# Patient Record
Sex: Male | Born: 1990 | Race: Black or African American | Hispanic: No | Marital: Single | Smoking: Current some day smoker
Health system: Southern US, Community
[De-identification: ages and names within clinical notes are randomized; demographics above are authoritative.]

## PROBLEM LIST (undated history)

## (undated) DIAGNOSIS — F840 Autistic disorder: Secondary | ICD-10-CM

## (undated) DIAGNOSIS — F909 Attention-deficit hyperactivity disorder, unspecified type: Secondary | ICD-10-CM

## (undated) HISTORY — PX: ADENOIDECTOMY: SUR15

## (undated) HISTORY — PX: TONSILLECTOMY: SUR1361

---
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
## 2011-08-16 ENCOUNTER — Emergency Department (HOSPITAL_COMMUNITY)
Admission: EM | Admit: 2011-08-16 | Discharge: 2011-08-16 | Disposition: A | Discharge status: Home / Self Care | Payer: Medicaid Other | Attending: Emergency Medicine | Admitting: Emergency Medicine

## 2011-08-16 ENCOUNTER — Emergency Department (HOSPITAL_COMMUNITY): Payer: Medicaid Other

## 2011-08-16 ENCOUNTER — Encounter (HOSPITAL_COMMUNITY): Payer: Self-pay | Admitting: Emergency Medicine

## 2011-08-16 DIAGNOSIS — Y9302 Activity, running: Secondary | ICD-10-CM | POA: Insufficient documentation

## 2011-08-16 DIAGNOSIS — X500XXA Overexertion from strenuous movement or load, initial encounter: Secondary | ICD-10-CM | POA: Insufficient documentation

## 2011-08-16 DIAGNOSIS — S82899A Other fracture of unspecified lower leg, initial encounter for closed fracture: Secondary | ICD-10-CM | POA: Insufficient documentation

## 2011-08-16 MED ORDER — ACETAMINOPHEN-CODEINE #3 300-30 MG PO TABS
1.0000 | ORAL_TABLET | Freq: Four times a day (QID) | ORAL | Status: AC | PRN
Start: 2011-08-16 — End: 2011-08-26

## 2011-08-16 MED ORDER — IBUPROFEN 800 MG PO TABS: 800.0000 mg | ORAL_TABLET | Freq: Three times a day (TID) | ORAL | Status: AC

## 2011-08-16 MED ORDER — ACETAMINOPHEN-CODEINE #3 300-30 MG PO TABS
2.0000 | ORAL_TABLET | Freq: Once | ORAL | Status: AC
  Administered 2011-08-16: 2 via ORAL
  Filled 2011-08-16: qty 2

## 2011-08-16 NOTE — ED Provider Notes (Signed)
History     CSN: [id number]  Arrival date & time 08/16/11  1737   First MD Initiated Contact with Patient 08/16/11 1820      Chief Complaint  Patient presents with  . Ankle Pain    (Consider location/radiation/quality/duration/timing/severity/associated sxs/prior treatment) HPI  Patient presents to emergency department with his mother at bedside with complaint of left ankle injury that happened yesterday when he was running downhill to go to work. Patient states that he inverted his ankle causing pain and swelling. Patient states that he went on to work but had increasing pain and swelling throughout work. Patient states that today he's had increased pain with ambulating. Patient complains of ongoing swelling but denies break in skin. He denies additional injury. Mother states she gave him ibuprofen yesterday with some relief of pain. Pain is aggravated by movement and ambulation and mildly improved with rest. He denies extremity numbness or tingling. He has no known medical problems and takes no medicines on regular basis.  History reviewed. No pertinent past medical history.  Past Surgical History  Procedure Date  . Tonsillectomy   . Adenoidectomy     History reviewed. No pertinent family history.  History  Substance Use Topics  . Smoking status: Never Smoker   . Smokeless tobacco: Not on file  . Alcohol Use: No      Review of Systems  All other systems reviewed and are negative.    Allergies  Penicillins  Home Medications   Current Outpatient Rx  Name Route Sig Dispense Refill  . IBUPROFEN 200 MG PO TABS Oral Take 200 mg by mouth every 8 (eight) hours as needed. For pain.      BP 119/66  Pulse 71  Temp 98.7 F (37.1 C) (Oral)  Resp 16  SpO2 100%  Physical Exam  Nursing note and vitals reviewed. Constitutional: He is oriented to person, place, and time. He appears well-developed.  HENT:  Head: Normocephalic and atraumatic.  Eyes: Conjunctivae are  normal.  Neck: Normal range of motion. Neck supple.  Cardiovascular: Normal rate.   Pulmonary/Chest: Effort normal.  Musculoskeletal: Normal range of motion. He exhibits edema and tenderness (decreased range of motion of left ankle due to pain. Tenderness to palpation of left lateral ankle with swelling of soft tissue of left lateral ankle. No tenderness to palpation of lower extremity or forefoot. Wiggling toes without difficulty. Good pedal p).  Neurological: He is alert and oriented to person, place, and time.  Skin: Skin is warm and dry. No rash noted. No erythema. No pallor.    ED Course  Procedures (including critical care time)  By mouth Tylenol #3, ice, crutches, and splint.  Labs Reviewed - No data to display Dg Ankle Complete Left  08/16/2011  *RADIOLOGY REPORT*  Clinical Data: Twisting injury of left ankle with lateral pain.  LEFT ANKLE COMPLETE - 3+ VIEW  Comparison: None.  Findings: Soft tissue swelling present particularly overlying the lateral aspect of the ankle.  There is subtle cortical irregularity and lucency in the lateral malleolus likely consistent with nondisplaced fracture.  On the mortise view, there also is associated mild widening of the medial mortise.  No other fractures are identified.  IMPRESSION: Nondisplaced fracture of the lateral malleolus with associated mild widening of the medial mortise.  Original Report Authenticated By: Reola Calkins, M.D.     1. Ankle fracture       MDM  Left lower extremity neurovascularly intact. Splinted in the ER and given crutches.  Spoke at length with patient his mother about the need for close orthopedic followup for further evaluation and management of his ankle fracture. They both voiced their understanding agreeable plan. I told him that he should not bare weight on this injury. He voices understanding.        [place], [place] 08/16/11 1928

## 2011-08-16 NOTE — ED Notes (Signed)
Patient reports that he tripped and fell yesterday and has pain to ankle - and swelling. The patient denies any cracks or pops with injury

## 2011-08-17 NOTE — ED Provider Notes (Signed)
Medical screening examination/treatment/procedure(s) were performed by non-physician practitioner and as supervising physician I was immediately available for consultation/collaboration.  Mithcell Schumpert, MD 08/17/11 2336 

## 2011-11-11 ENCOUNTER — Encounter (HOSPITAL_COMMUNITY): Payer: Self-pay | Admitting: Emergency Medicine

## 2011-11-11 ENCOUNTER — Emergency Department (HOSPITAL_COMMUNITY)
Admission: EM | Admit: 2011-11-11 | Discharge: 2011-11-11 | Disposition: A | Discharge status: Home / Self Care | Payer: Medicaid Other | Attending: Emergency Medicine | Admitting: Emergency Medicine

## 2011-11-11 DIAGNOSIS — K130 Diseases of lips: Secondary | ICD-10-CM | POA: Insufficient documentation

## 2011-11-11 DIAGNOSIS — K1370 Unspecified lesions of oral mucosa: Secondary | ICD-10-CM

## 2011-11-11 NOTE — ED Notes (Signed)
Pt reports bit inside lower lip 2 weeks ago, has sore to lip now, denies pain to sore

## 2011-11-12 NOTE — ED Provider Notes (Signed)
History     CSN: [id number]  Arrival date & time 11/11/11  2020   First MD Initiated Contact with Patient 11/11/11 2108      Chief Complaint  Patient presents with  . Lip Sore    HPI  History provided by the patient. Patient is a 21 year old male with no significant PMH who presents with concerns for lip swelling. Patient reports biting his lower lip with injury and bleeding several weeks ago. Since that time he reports the lip was healing. There was an area that began to swell that he poked and caused significant bleeding. Now since that time there has been another bump to her lower lip. He denies any pain but states this is large and give [place] an away if his teeth and he sometimes bites area accidentally. He denies having any history of keloids are on the usual healing and scarring. He denies any tobacco use. Patient denies having any other similar symptoms previously.    History reviewed. No pertinent past medical history.  Past Surgical History  Procedure Date  . Tonsillectomy   . Adenoidectomy     History reviewed. No pertinent family history.  History  Substance Use Topics  . Smoking status: Never Smoker   . Smokeless tobacco: Not on file  . Alcohol Use: No      Review of Systems  Constitutional: Negative for fever and chills.  HENT: Negative for sore throat and trouble swallowing.   Gastrointestinal: Negative for nausea and vomiting.    Allergies  Penicillins  Home Medications   Current Outpatient Rx  Name Route Sig Dispense Refill  . IBUPROFEN 200 MG PO TABS Oral Take 200 mg by mouth every 8 (eight) hours as needed. For pain.      BP 137/72  Pulse 71  Temp 98.1 F (36.7 C) (Oral)  Resp 18  SpO2 100%  Physical Exam  Nursing note and vitals reviewed. Constitutional: He is oriented to person, place, and time. He appears well-developed and well-nourished. No distress.  HENT:  Head: Normocephalic.       1 cm x 4mm deep circular pedunculated lesion to  the medial inner lower lip. No color change of the mucosa. No bleeding or drainage. Lesion is firm without fluctuance. No increased vascularity.  Neck: Normal range of motion. Neck supple.  Cardiovascular: Normal rate and regular rhythm.   Pulmonary/Chest: Effort normal and breath sounds normal.  Lymphadenopathy:    He has no cervical adenopathy.  Neurological: He is alert and oriented to person, place, and time.    ED Course  Procedures      1. Oral lesion       MDM  Patient seen and evaluated. Patient no acute distress. Patient was referred to ENT and oral surgeon for further evaluation of this lesion.        Angus Seller, [place] 11/12/11 [phone]

## 2011-11-13 NOTE — ED Provider Notes (Signed)
Medical screening examination/treatment/procedure(s) were performed by non-physician practitioner and as supervising physician I was immediately available for consultation/collaboration.  Yamen Castrogiovanni, MD 11/13/11 0642 

## 2011-11-23 ENCOUNTER — Other Ambulatory Visit: Payer: Self-pay | Admitting: Otolaryngology

## 2013-01-20 IMAGING — CR DG ANKLE COMPLETE 3+V*L*
3 series · 3 of 3 positions shown · non-contrast
Comparison: None.

CLINICAL DATA: Twisting injury of left ankle with lateral pain.

LEFT ANKLE COMPLETE - 3+ VIEW

[x ankle ap left]
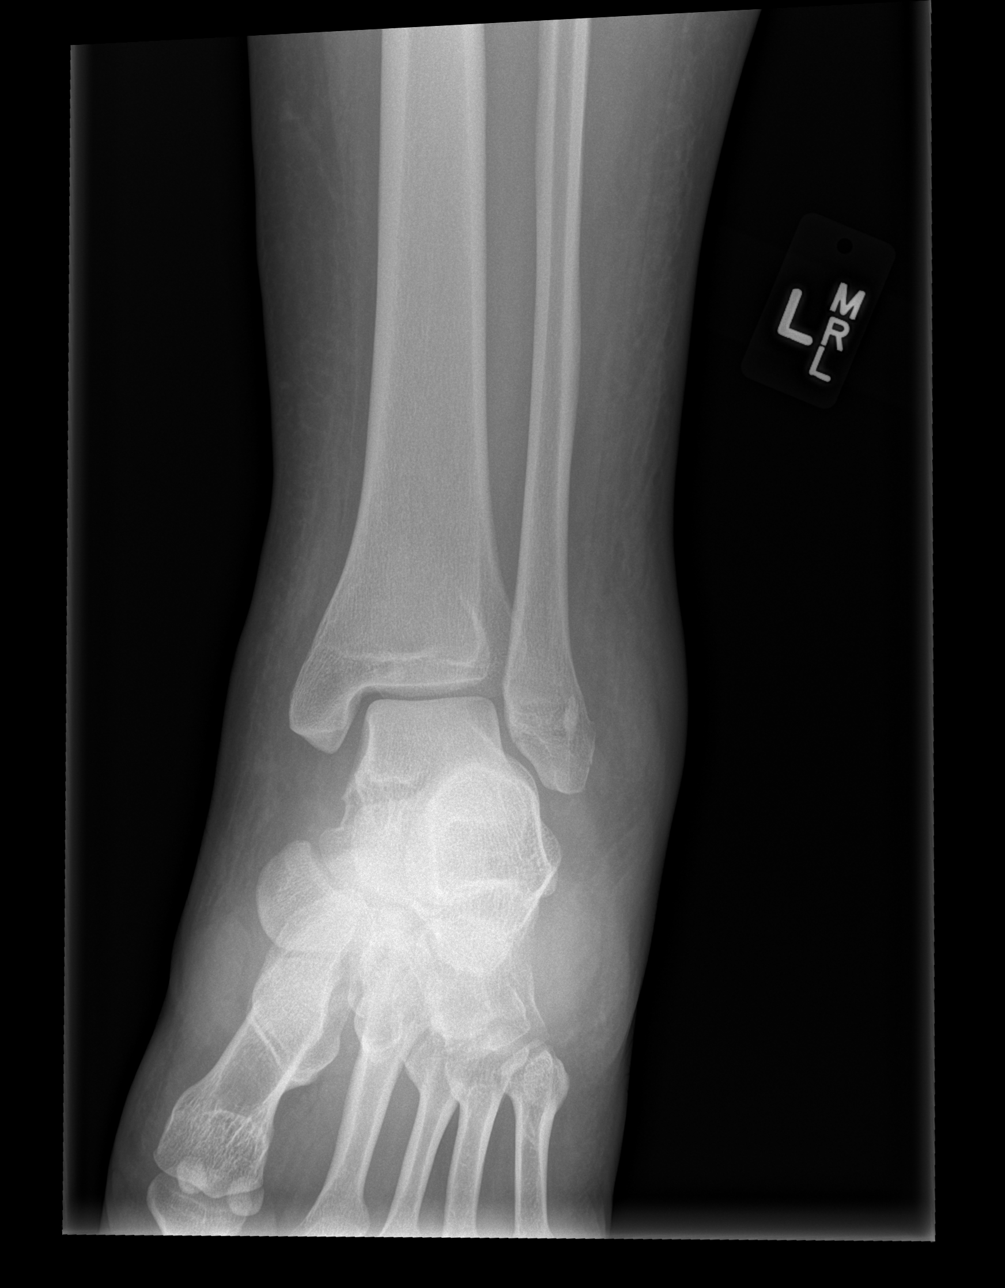

[x ankle obl left]
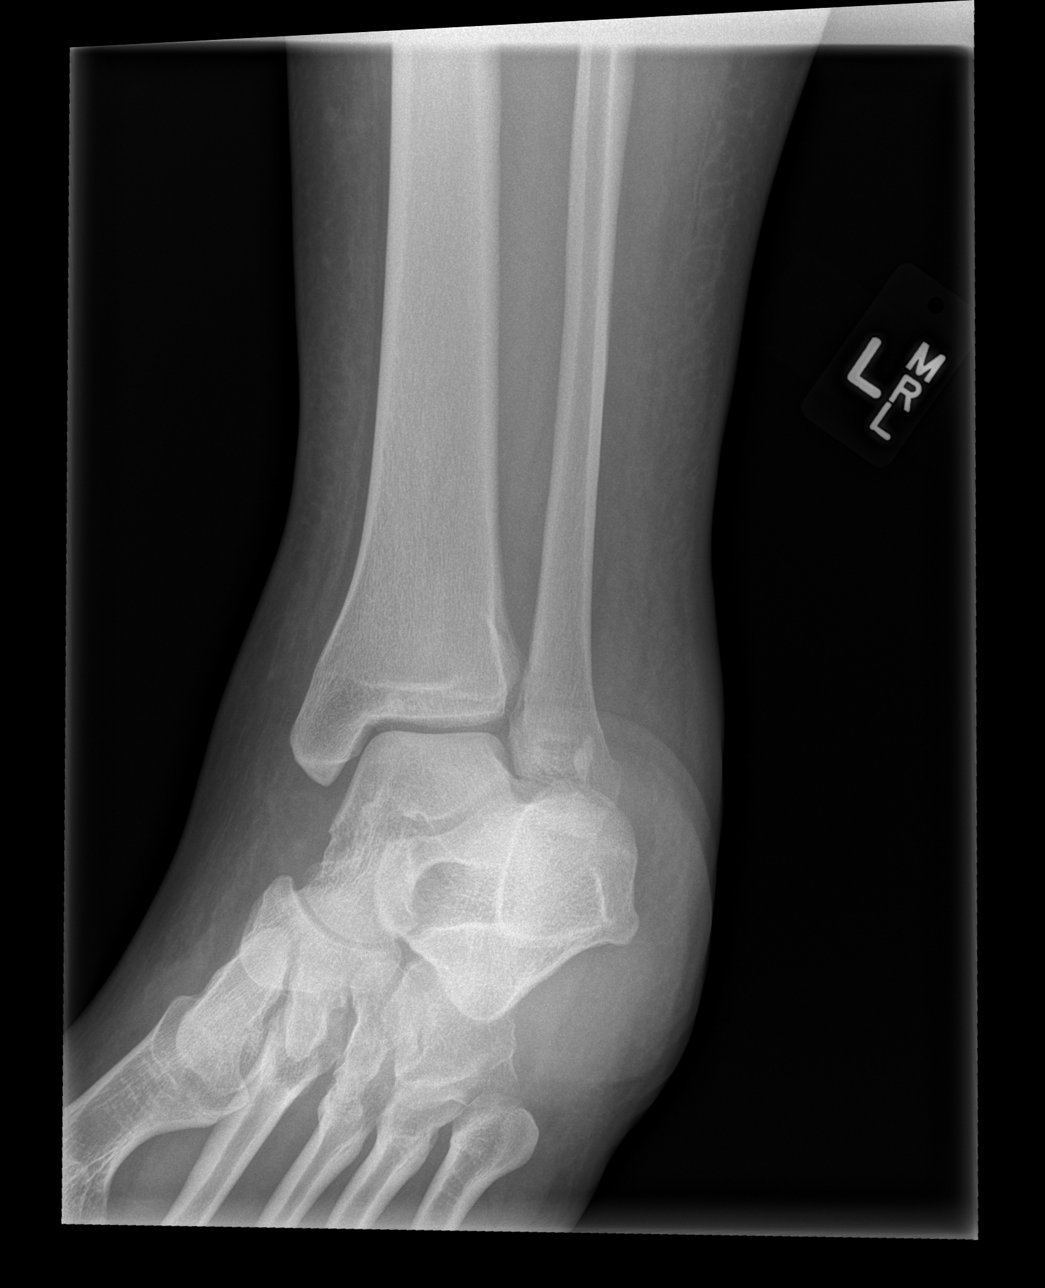

[x ankle lat left]
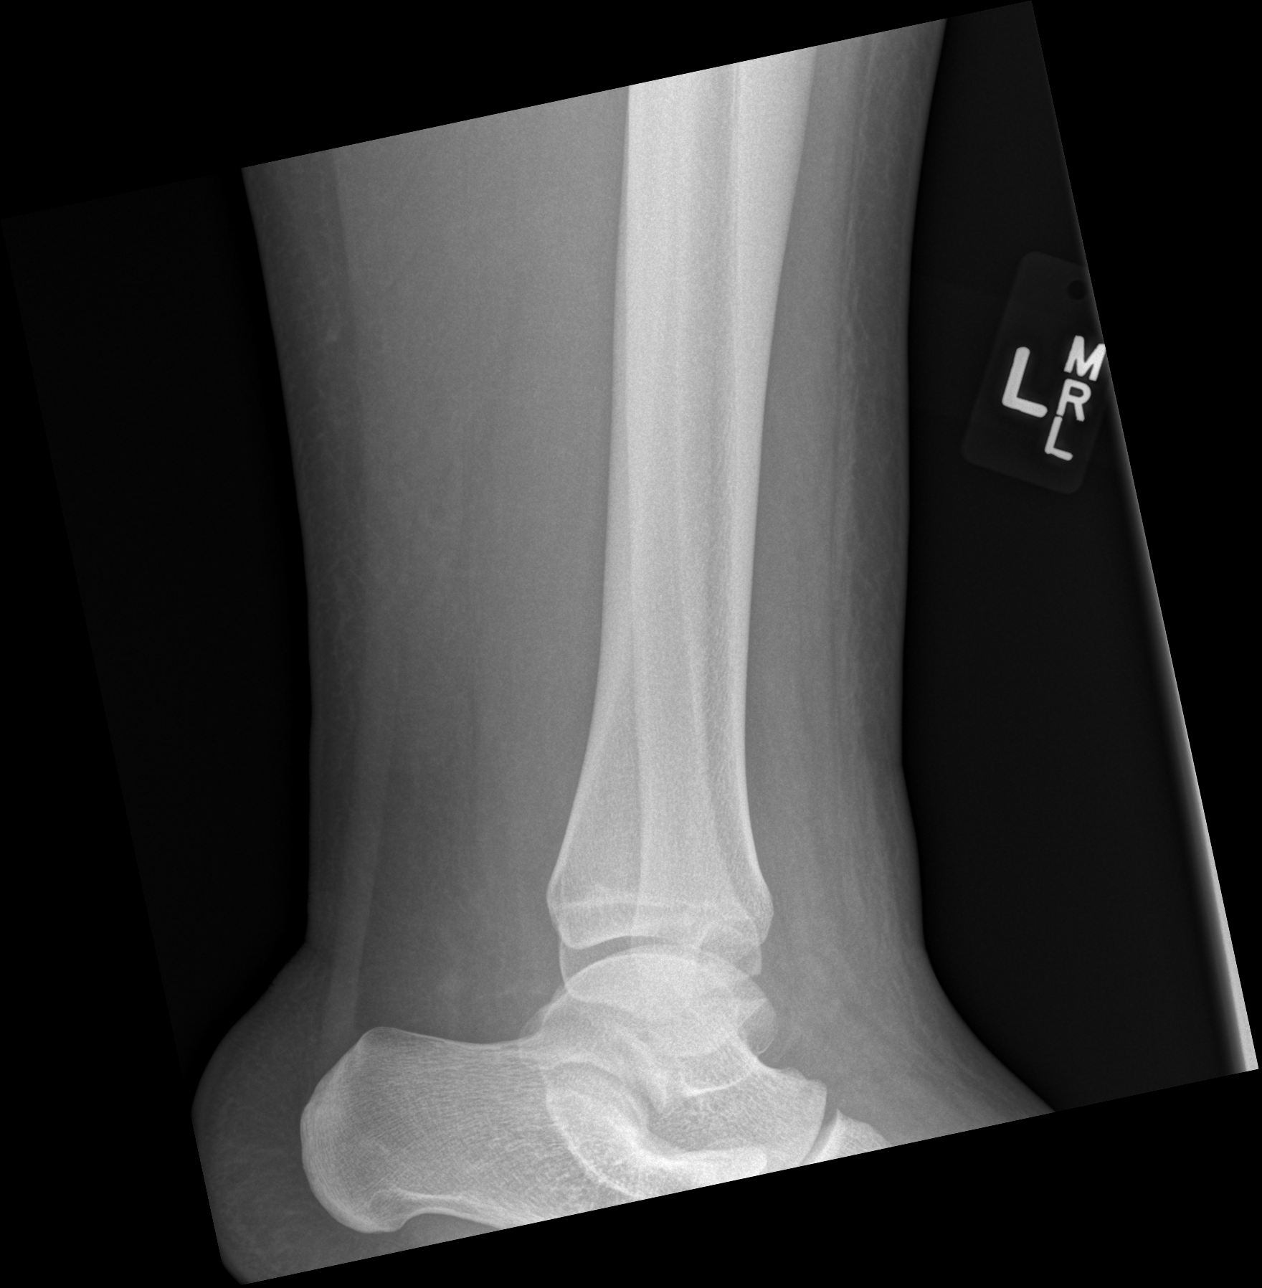

[3 of 3 positions shown; findings below may reference images not displayed]

FINDINGS: Soft tissue swelling present particularly overlying the
lateral aspect of the ankle.  There is subtle cortical irregularity
and lucency in the lateral malleolus likely consistent with
nondisplaced fracture.  On the mortise view, there also is
associated mild widening of the medial mortise.  No other fractures
are identified.
IMPRESSION: Nondisplaced fracture of the lateral malleolus with associated mild
widening of the medial mortise.

## 2016-07-18 ENCOUNTER — Emergency Department (HOSPITAL_COMMUNITY)
Admission: EM | Admit: 2016-07-18 | Discharge: 2016-07-19 | Disposition: A | Discharge status: Home / Self Care | Payer: Medicaid Other | Attending: Emergency Medicine | Admitting: Emergency Medicine

## 2016-07-18 ENCOUNTER — Encounter (HOSPITAL_COMMUNITY): Payer: Self-pay | Admitting: Family Medicine

## 2016-07-18 DIAGNOSIS — F909 Attention-deficit hyperactivity disorder, unspecified type: Secondary | ICD-10-CM | POA: Diagnosis not present

## 2016-07-18 DIAGNOSIS — F918 Other conduct disorders: Secondary | ICD-10-CM | POA: Diagnosis present

## 2016-07-18 DIAGNOSIS — F840 Autistic disorder: Secondary | ICD-10-CM | POA: Insufficient documentation

## 2016-07-18 DIAGNOSIS — R4689 Other symptoms and signs involving appearance and behavior: Secondary | ICD-10-CM

## 2016-07-18 DIAGNOSIS — F6381 Intermittent explosive disorder: Secondary | ICD-10-CM

## 2016-07-18 DIAGNOSIS — Z79899 Other long term (current) drug therapy: Secondary | ICD-10-CM | POA: Diagnosis not present

## 2016-07-18 DIAGNOSIS — F17210 Nicotine dependence, cigarettes, uncomplicated: Secondary | ICD-10-CM | POA: Diagnosis not present

## 2016-07-18 DIAGNOSIS — R4589 Other symptoms and signs involving emotional state: Secondary | ICD-10-CM | POA: Diagnosis not present

## 2016-07-18 HISTORY — DX: Attention-deficit hyperactivity disorder, unspecified type: F90.9

## 2016-07-18 HISTORY — DX: Autistic disorder: F84.0

## 2016-07-18 LAB — CBC
HCT: 35.7 % — ABNORMAL LOW (ref 39.0–52.0)
Hemoglobin: 12.3 g/dL — ABNORMAL LOW (ref 13.0–17.0)
MCH: 25.2 pg — AB (ref 26.0–34.0)
MCHC: 34.5 g/dL (ref 30.0–36.0)
MCV: 73.2 fL — AB (ref 78.0–100.0)
Platelets: 252 10*3/uL (ref 150–400)
RBC: 4.88 MIL/uL (ref 4.22–5.81)
RDW: 15.6 % — AB (ref 11.5–15.5)
WBC: 11.3 10*3/uL — ABNORMAL HIGH (ref 4.0–10.5)

## 2016-07-18 LAB — COMPREHENSIVE METABOLIC PANEL
ALT: 15 U/L — ABNORMAL LOW (ref 17–63)
AST: 23 U/L (ref 15–41)
Albumin: 4.5 g/dL (ref 3.5–5.0)
Alkaline Phosphatase: 57 U/L (ref 38–126)
Anion gap: 8 (ref 5–15)
BILIRUBIN TOTAL: 0.8 mg/dL (ref 0.3–1.2)
BUN: 14 mg/dL (ref 6–20)
CO2: 23 mmol/L (ref 22–32)
CREATININE: 0.96 mg/dL (ref 0.61–1.24)
Calcium: 9.3 mg/dL (ref 8.9–10.3)
Chloride: 105 mmol/L (ref 101–111)
GFR calc Af Amer: >60 mL/min (ref >60)
Glucose, Bld: 94 mg/dL (ref 65–99)
Potassium: 3.5 mmol/L (ref 3.5–5.1)
Sodium: 136 mmol/L (ref 135–145)
TOTAL PROTEIN: 8.2 g/dL — AB (ref 6.5–8.1)

## 2016-07-18 LAB — SALICYLATE LEVEL: Salicylate Lvl: <7 mg/dL (ref 2.8–30.0)

## 2016-07-18 LAB — ACETAMINOPHEN LEVEL: Acetaminophen (Tylenol), Serum: <10 ug/mL — ABNORMAL LOW (ref 10–30)

## 2016-07-18 LAB — ETHANOL: Alcohol, Ethyl (B): <5 mg/dL (ref <5)

## 2016-07-18 NOTE — ED Triage Notes (Signed)
Patient is INVOLUNTARY COMMITTED [place] [employer] IVC papers state he is hostile and aggressive toward his grandmother, which he resides with, and his mother. Pt has been calm and cooperative with police officers.

## 2016-07-18 NOTE — ED Notes (Addendum)
Grandmother called requesting to be called for information:  [phone] Madilyn Fireman(Bonita Nichols)  She stated "I know his hx and all his meds because he's been with me for 23 years."

## 2016-07-18 NOTE — ED Notes (Signed)
Bed: [id number] Expected date:  Expected time:  Means of arrival:  Comments: Jenne CampusMcQueen

## 2016-07-19 DIAGNOSIS — R4589 Other symptoms and signs involving emotional state: Secondary | ICD-10-CM | POA: Diagnosis not present

## 2016-07-19 DIAGNOSIS — F17210 Nicotine dependence, cigarettes, uncomplicated: Secondary | ICD-10-CM | POA: Diagnosis not present

## 2016-07-19 DIAGNOSIS — F6381 Intermittent explosive disorder: Secondary | ICD-10-CM

## 2016-07-19 DIAGNOSIS — R4689 Other symptoms and signs involving appearance and behavior: Secondary | ICD-10-CM | POA: Diagnosis present

## 2016-07-19 LAB — RAPID URINE DRUG SCREEN, HOSP PERFORMED
Amphetamines: NOT DETECTED
Barbiturates: NOT DETECTED
Benzodiazepines: NOT DETECTED
Cocaine: NOT DETECTED
Opiates: NOT DETECTED
Tetrahydrocannabinol: NOT DETECTED

## 2016-07-19 NOTE — ED Notes (Signed)
Pt spoke to his garndmother , she is coming to give him ride back home.

## 2016-07-19 NOTE — ED Notes (Signed)
TTS assessment in progress. 

## 2016-07-19 NOTE — Consult Note (Signed)
[facility] Psychiatry Consult   Reason for Consult:  Aggressive behavior Referring Physician:  EDP Patient Identification: Adam Weiss MRN:  [id number] Principal Diagnosis: Aggressive behavior  Diagnosis:   Patient Active Problem List   Diagnosis Date Noted  . Aggressive behavior [R45.89]     Total Time spent with patient: 30 minutes  Subjective:   Adam Weiss is a 26 y.o. male patient admitted with aggressive behavior .  HPI:  Adam Weiss is a 26 year old male who presented to the [facility], under IVC, placed by his Adam Weiss for aggressive behavior. Pt stated he became upset and frustrated over the loss of a credit card. Pt stated he does not feel that way today and wants to go home. Pt lives with his Adam Weiss. Pt has a history of IDD and Autism. 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 Pt stated he will "walk around the block or stay in his room" if he becomes frustrated again. Today,  Pt denies suicidal/homicidal ideation, denies auditory/visual hallucinations and does not appear to be responding to internal stimuli. Pt was calm and cooperative, alert & oriented x 3, dressed in paper scrubs and lying on the bed. Pt is psychiatrically cleared for discharge home to his Adam Weiss's house.   Past Psychiatric History: Aggressive behavior  Risk to Self: None Risk to Others:None Prior Inpatient Therapy: Prior Inpatient Therapy: No Prior Therapy Dates: N/A Prior Therapy Facilty/Provider(s): N/A Reason for Treatment: N/A Prior Outpatient Therapy: Prior Outpatient Therapy: Yes Prior Therapy Dates: Unknown Prior Therapy Facilty/Provider(s): Pt does not remember Reason for Treatment: Pt does not remember Does patient have an ACCT team?: No Does patient have Intensive In-House Services?  : No Does patient have [facility] services? : Unknown Does patient have P4CC services?: No  Past Medical History:  Past Medical History:  Diagnosis Date  .  ADHD   . Autism     Past Surgical History:  Procedure Laterality Date  . ADENOIDECTOMY    . TONSILLECTOMY     Family History: History reviewed. No pertinent family history. Family Psychiatric  History: Unknown Social History:  History  Alcohol Use No     History  Drug Use No    Social History   Social History  . Marital status: Single    Spouse name: N/A  . Number of children: N/A  . Years of education: N/A   Social History Main Topics  . Smoking status: Current Some Day Smoker    Types: Cigarettes  . Smokeless tobacco: Never Used  . Alcohol use No  . Drug use: No  . Sexual activity: Not Asked   Other Topics Concern  . None   Social History Narrative  . None   Additional Social History:    Allergies:   Allergies  Allergen Reactions  . Penicillins Hives and Swelling    Labs:  Results for orders placed or performed during the hospital encounter of 07/18/16 (from the past 48 hour(s))  Comprehensive metabolic panel     Status: Abnormal   Collection Time: 07/18/16  8:17 PM  Result Value Ref Range   Sodium 136 135 - 145 mmol/L   Potassium 3.5 3.5 - 5.1 mmol/L   Chloride 105 101 - 111 mmol/L   CO2 23 22 - 32 mmol/L   Glucose, Bld 94 65 - 99 mg/dL   BUN 14 6 - 20 mg/dL   Creatinine, Ser 0.96 0.61 - 1.24 mg/dL   Calcium 9.3 8.9 - 10.3 mg/dL  Total Protein 8.2 (H) 6.5 - 8.1 g/dL   Albumin 4.5 3.5 - 5.0 g/dL   AST 23 15 - 41 U/L   ALT 15 (L) 17 - 63 U/L   Alkaline Phosphatase 57 38 - 126 U/L   Total Bilirubin 0.8 0.3 - 1.2 mg/dL   GFR calc non Af Amer >60 >60 mL/min   GFR calc Af Amer >60 >60 mL/min    Comment: (NOTE) The eGFR has been calculated using the CKD EPI equation. This calculation has not been validated in all clinical situations. eGFR's persistently <60 mL/min signify possible Chronic Kidney Disease.    Anion gap 8 5 - 15  Ethanol     Status: None   Collection Time: 07/18/16  8:17 PM  Result Value Ref Range   Alcohol, Ethyl (B) <5 <5  mg/dL    Comment:        LOWEST DETECTABLE LIMIT FOR SERUM ALCOHOL IS 5 mg/dL FOR MEDICAL PURPOSES ONLY   Salicylate level     Status: None   Collection Time: 07/18/16  8:17 PM  Result Value Ref Range   Salicylate Lvl <6.2 2.8 - 30.0 mg/dL  Acetaminophen level     Status: Abnormal   Collection Time: 07/18/16  8:17 PM  Result Value Ref Range   Acetaminophen (Tylenol), Serum <10 (L) 10 - 30 ug/mL    Comment:        THERAPEUTIC CONCENTRATIONS VARY SIGNIFICANTLY. A RANGE OF 10-30 ug/mL MAY BE AN EFFECTIVE CONCENTRATION FOR MANY PATIENTS. HOWEVER, SOME ARE BEST TREATED AT CONCENTRATIONS OUTSIDE THIS RANGE. ACETAMINOPHEN CONCENTRATIONS >150 ug/mL AT 4 HOURS AFTER INGESTION AND >50 ug/mL AT 12 HOURS AFTER INGESTION ARE OFTEN ASSOCIATED WITH TOXIC REACTIONS.   cbc     Status: Abnormal   Collection Time: 07/18/16  8:17 PM  Result Value Ref Range   WBC 11.3 (H) 4.0 - 10.5 K/uL   RBC 4.88 4.22 - 5.81 MIL/uL   Hemoglobin 12.3 (L) 13.0 - 17.0 g/dL   HCT 35.7 (L) 39.0 - 52.0 %   MCV 73.2 (L) 78.0 - 100.0 fL   MCH 25.2 (L) 26.0 - 34.0 pg   MCHC 34.5 30.0 - 36.0 g/dL   RDW 15.6 (H) 11.5 - 15.5 %   Platelets 252 150 - 400 K/uL  Rapid urine drug screen (hospital performed)     Status: None   Collection Time: 07/18/16  8:17 PM  Result Value Ref Range   Opiates NONE DETECTED NONE DETECTED   Cocaine NONE DETECTED NONE DETECTED   Benzodiazepines NONE DETECTED NONE DETECTED   Amphetamines NONE DETECTED NONE DETECTED   Tetrahydrocannabinol NONE DETECTED NONE DETECTED   Barbiturates NONE DETECTED NONE DETECTED    No current facility-administered medications for this encounter.    Current Outpatient Prescriptions  Medication Sig Dispense Refill  . ibuprofen (ADVIL,MOTRIN) 200 MG tablet Take 200 mg by mouth every 8 (eight) hours as needed. For pain.      Musculoskeletal: Strength & Muscle Tone: within normal limits Gait & Station: normal Patient leans: N/A  Psychiatric Specialty  Exam: Physical Exam  Constitutional: He appears well-developed and well-nourished.  HENT:  Head: Normocephalic.  Neck: Normal range of motion.  Respiratory: Effort normal.  Musculoskeletal: Normal range of motion.  Neurological: He is alert.    Review of Systems  All other systems reviewed and are negative.   Blood pressure (!) 109/49, pulse 65, temperature 97.5 F (36.4 C), temperature source Oral, resp. rate 20, height 5' (1.524 m), weight  136.1 kg (300 lb), SpO2 100 %.Body mass index is 58.59 kg/m.  General Appearance: Casual  Eye Contact:  Good  Speech:  Clear and Coherent and Normal Rate  Volume:  Normal  Mood:  Euthymic  Affect:  Congruent  Thought Process:  Coherent  Orientation:  Full (Time, Place, and Person)  Thought Content:  Logical  Suicidal Thoughts:  No  Homicidal Thoughts:  No  Memory:  Immediate;   Good Recent;   Good Remote;   Fair  Judgement:  Fair  Insight:  Fair  Psychomotor Activity:  Normal  Concentration:  Concentration: Good and Attention Span: Good  Recall:  [employer] of Knowledge:  Fair  Language:  Good  Akathisia:  No  Handed:  Right  AIMS (if indicated):     Assets:  Agricultural consultant Housing Leisure Time Physical Health Resilience Social Support Vocational/Educational  ADL's:  Intact  Cognition:  WNL  Sleep:        Treatment Plan Summary: Plan Aggressive behavior  Discharge home with Adam Weiss Take your home medications as prescribed Follow up with [facility] [place] for psychiatry and medication management    Disposition: No evidence of imminent risk to self or others at present.   Patient does not meet criteria for psychiatric inpatient admission.  Ethelene Hal, NP 07/19/2016 10:50 AM  Patient seen face-to-face for psychiatric evaluation, chart reviewed and case discussed with the physician extender and developed treatment plan. Reviewed the information documented  and agree with the treatment plan. Corena Pilgrim, MD

## 2016-07-19 NOTE — Discharge Instructions (Signed)
For your ongoing behavioral health needs you are advised to follow up with [facility].  New patients are seen at their walk-in clinic.  Walk-in hours are Monday - Friday from 8:00 am - 12:00 pm, and from 1:00 pm - 3:00 pm.  Walk-in patients are seen on a first come, first served basis, so try to arrive as early as possible for the best chance of being seen the same day.  There is an initial fee of $22.50: ° °     [facility] °     [address] °     [place], [place] [address] °     [phone] °
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
## 2016-07-19 NOTE — [facility] Suicide Risk Assessment (Signed)
Suicide Risk Assessment  Discharge Assessment   [facility]   Principal Problem: Aggressive behavior Discharge Diagnoses:  Patient Active Problem List   Diagnosis Date Noted  . Aggressive behavior [R45.89]     Priority: High    Total Time spent with patient: 30 minutes  Musculoskeletal: Strength & Muscle Tone: within normal limits Gait & Station: normal Patient leans: N/A Psychiatric Specialty Exam: Physical Exam  Constitutional: He appears well-developed and well-nourished.  HENT:  Head: Normocephalic.  Neck: Normal range of motion.  Respiratory: Effort normal.  Musculoskeletal: Normal range of motion.  Neurological: He is alert.   Review of Systems  All other systems reviewed and are negative.  Blood pressure (!) 109/49, pulse 65, temperature 97.5 F (36.4 C), temperature source Oral, resp. rate 20, height 5' (1.524 m), weight 136.1 kg (300 lb), SpO2 100 %.Body mass index is 58.59 kg/m. General Appearance: Casual Eye Contact:  Good Speech:  Clear and Coherent and Normal Rate Volume:  Normal Mood:  Euthymic Affect:  Congruent Thought Process:  Coherent Orientation:  Full (Time, Place, and Person) Thought Content:  Logical Suicidal Thoughts:  No Homicidal Thoughts:  No Memory:  Immediate;   Good Recent;   Good Remote;   Fair Judgement:  Fair Insight:  Fair Psychomotor Activity:  Normal Concentration:  Concentration: Good and Attention Span: Good Recall:  [employer] of Knowledge:  Fair Language:  Good Akathisia:  No Handed:  Right AIMS (if indicated):    Assets:  ArchitectCommunication Skills Financial Resources/Insurance Housing Leisure Time Physical Health Resilience Social Support Vocational/Educational ADL's:  Intact Cognition:  WNL Sleep:      Mental Status Per Nursing Assessment::   On Admission:   agitated  Demographic Factors:  Male  Loss Factors: NA  Historical Factors: NA  Risk Reduction Factors:   Employed  and Positive social support  Continued Clinical Symptoms:  Severe Anxiety and/or Agitation  Cognitive Features That Contribute To Risk:  None    Suicide Risk:  Minimal: No identifiable suicidal ideation.  Patients presenting with no risk factors but with morbid ruminations; may be classified as minimal risk based on the severity of the depressive symptoms    Plan Of Care/Follow-up recommendations:  Activity:  as tolerated Diet:  Heart Healthy  Laveda AbbeLaurie Britton Parks, NP 07/19/2016, 11:05 AM
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
## 2016-07-19 NOTE — BH Assessment (Signed)
[facility]HH Assessment Progress Note  Per Mojeed Akintayo, MD, this pt does not require psychiatric hospitalization at this time.  Pt is to be discharged from [facility] with recommendation to follow up with [facility].  This has been included in pt's discharge instructions.  Pt's nurse has been notified.  Honesti Seaberg, MA Triage Specialist [phone]     

## 2023-06-18 ENCOUNTER — Other Ambulatory Visit (HOSPITAL_COMMUNITY): Payer: Self-pay

## 2023-06-18 MED ORDER — WEGOVY 0.25 MG/0.5ML ~~LOC~~ SOAJ
0.2500 mg | SUBCUTANEOUS | 0 refills | Status: AC
  Filled 2023-06-18 – 2023-07-08 (×2): qty 2, 28d supply, fill #0

## 2023-06-28 ENCOUNTER — Other Ambulatory Visit (HOSPITAL_COMMUNITY): Payer: Self-pay

## 2023-07-08 ENCOUNTER — Other Ambulatory Visit (HOSPITAL_COMMUNITY): Payer: Self-pay

## 2023-07-10 ENCOUNTER — Other Ambulatory Visit (HOSPITAL_COMMUNITY): Payer: Self-pay

## 2023-07-16 ENCOUNTER — Other Ambulatory Visit: Payer: Self-pay

## 2023-07-19 ENCOUNTER — Other Ambulatory Visit (HOSPITAL_COMMUNITY): Payer: Self-pay

## 2023-08-06 ENCOUNTER — Other Ambulatory Visit (HOSPITAL_BASED_OUTPATIENT_CLINIC_OR_DEPARTMENT_OTHER): Payer: Self-pay

## 2023-08-06 ENCOUNTER — Other Ambulatory Visit (HOSPITAL_COMMUNITY): Payer: Self-pay

## 2023-08-06 MED ORDER — WEGOVY 0.5 MG/0.5ML ~~LOC~~ SOAJ
0.5000 mg | SUBCUTANEOUS | 0 refills | Status: AC
  Filled 2023-08-06: qty 2, 30d supply, fill #0
  Filled 2023-08-27: qty 2, 28d supply, fill #0

## 2023-08-07 ENCOUNTER — Other Ambulatory Visit (HOSPITAL_COMMUNITY): Payer: Self-pay

## 2023-08-08 ENCOUNTER — Other Ambulatory Visit (HOSPITAL_COMMUNITY): Payer: Self-pay

## 2023-08-19 ENCOUNTER — Other Ambulatory Visit (HOSPITAL_COMMUNITY): Payer: Self-pay

## 2023-08-27 ENCOUNTER — Other Ambulatory Visit (HOSPITAL_COMMUNITY): Payer: Self-pay

## 2023-08-28 ENCOUNTER — Other Ambulatory Visit (HOSPITAL_COMMUNITY): Payer: Self-pay

## 2023-09-27 ENCOUNTER — Other Ambulatory Visit (HOSPITAL_COMMUNITY): Payer: Self-pay

## 2023-09-30 ENCOUNTER — Other Ambulatory Visit (HOSPITAL_COMMUNITY): Payer: Self-pay

## 2023-10-01 ENCOUNTER — Other Ambulatory Visit (HOSPITAL_COMMUNITY): Payer: Self-pay

## 2023-10-02 ENCOUNTER — Other Ambulatory Visit (HOSPITAL_COMMUNITY): Payer: Self-pay

## 2023-10-03 ENCOUNTER — Other Ambulatory Visit (HOSPITAL_COMMUNITY): Payer: Self-pay

## 2023-10-07 ENCOUNTER — Other Ambulatory Visit (HOSPITAL_COMMUNITY): Payer: Self-pay

## 2023-10-07 MED ORDER — WEGOVY 1 MG/0.5ML ~~LOC~~ SOAJ
1.0000 mg | SUBCUTANEOUS | 0 refills | Status: AC
  Filled 2023-10-07: qty 2, 28d supply, fill #0

## 2023-10-07 MED ORDER — PHENTERMINE HCL 8 MG PO TABS
8.0000 mg | ORAL_TABLET | Freq: Three times a day (TID) | ORAL | 0 refills | Status: AC
  Filled 2023-10-07: qty 90, 30d supply, fill #0

## 2023-10-07 MED ORDER — LOSARTAN POTASSIUM 100 MG PO TABS
100.0000 mg | ORAL_TABLET | Freq: Every day | ORAL | 1 refills | Status: AC
  Filled 2023-10-07: qty 90, 90d supply, fill #0
  Filled 2024-01-02: qty 90, 90d supply, fill #1

## 2023-11-02 ENCOUNTER — Other Ambulatory Visit (HOSPITAL_COMMUNITY): Payer: Self-pay

## 2023-11-02 MED ORDER — WEGOVY 1.7 MG/0.75ML ~~LOC~~ SOAJ
1.7000 mg | SUBCUTANEOUS | 0 refills | Status: AC
  Filled 2023-11-02: qty 3, 28d supply, fill #0

## 2023-11-02 MED ORDER — PHENTERMINE HCL 8 MG PO TABS
8.0000 mg | ORAL_TABLET | Freq: Three times a day (TID) | ORAL | 0 refills | Status: AC
  Filled 2023-11-02: qty 90, 30d supply, fill #0

## 2023-11-04 ENCOUNTER — Other Ambulatory Visit (HOSPITAL_COMMUNITY): Payer: Self-pay

## 2023-11-05 ENCOUNTER — Other Ambulatory Visit (HOSPITAL_COMMUNITY): Payer: Self-pay

## 2023-11-06 ENCOUNTER — Other Ambulatory Visit (HOSPITAL_COMMUNITY): Payer: Self-pay

## 2023-11-12 ENCOUNTER — Other Ambulatory Visit (HOSPITAL_COMMUNITY): Payer: Self-pay

## 2023-11-12 MED ORDER — DIETHYLPROPION HCL 25 MG PO TABS
25.0000 mg | ORAL_TABLET | ORAL | 0 refills | Status: AC
  Filled 2023-11-12: qty 90, 30d supply, fill #0

## 2023-11-13 ENCOUNTER — Other Ambulatory Visit (HOSPITAL_COMMUNITY): Payer: Self-pay

## 2023-11-15 ENCOUNTER — Other Ambulatory Visit (HOSPITAL_COMMUNITY): Payer: Self-pay

## 2023-11-25 ENCOUNTER — Other Ambulatory Visit (HOSPITAL_COMMUNITY): Payer: Self-pay

## 2023-12-03 ENCOUNTER — Other Ambulatory Visit: Payer: Self-pay

## 2023-12-03 ENCOUNTER — Other Ambulatory Visit (HOSPITAL_COMMUNITY): Payer: Self-pay

## 2023-12-03 MED ORDER — LOSARTAN POTASSIUM 100 MG PO TABS
100.0000 mg | ORAL_TABLET | Freq: Every day | ORAL | 1 refills | Status: AC
  Filled 2023-12-03: qty 90, 90d supply, fill #0

## 2023-12-03 MED ORDER — PHENTERMINE HCL 8 MG PO TABS
8.0000 mg | ORAL_TABLET | Freq: Three times a day (TID) | ORAL | 0 refills | Status: AC
  Filled 2023-12-03: qty 90, 30d supply, fill #0

## 2023-12-03 MED ORDER — D3-50 1.25 MG (50000 UT) PO CAPS
50000.0000 [IU] | ORAL_CAPSULE | ORAL | 1 refills | Status: AC
  Filled 2023-12-03: qty 12, 84d supply, fill #0

## 2023-12-13 ENCOUNTER — Other Ambulatory Visit (HOSPITAL_COMMUNITY): Payer: Self-pay

## 2024-01-03 ENCOUNTER — Other Ambulatory Visit (HOSPITAL_COMMUNITY): Payer: Self-pay

## 2024-01-03 ENCOUNTER — Other Ambulatory Visit: Payer: Self-pay

## 2024-01-15 ENCOUNTER — Other Ambulatory Visit (HOSPITAL_COMMUNITY): Payer: Self-pay

## 2024-02-13 ENCOUNTER — Encounter (INDEPENDENT_AMBULATORY_CARE_PROVIDER_SITE_OTHER): Payer: Self-pay | Admitting: Family Medicine

## 2024-02-13 ENCOUNTER — Ambulatory Visit (INDEPENDENT_AMBULATORY_CARE_PROVIDER_SITE_OTHER): Payer: MEDICAID | Admitting: Family Medicine

## 2024-02-13 VITALS — BP 139/85 | HR 74 | Temp 97.7°F | Ht 70.0 in | Wt >= 6400 oz

## 2024-02-13 DIAGNOSIS — Z6841 Body Mass Index (BMI) 40.0 and over, adult: Secondary | ICD-10-CM

## 2024-02-13 DIAGNOSIS — E669 Obesity, unspecified: Secondary | ICD-10-CM

## 2024-02-13 DIAGNOSIS — I10 Essential (primary) hypertension: Secondary | ICD-10-CM

## 2024-02-13 DIAGNOSIS — F840 Autistic disorder: Secondary | ICD-10-CM

## 2024-02-13 NOTE — Progress Notes (Signed)
 " Adam DOROTHA Jenkins, DO, ABFM, ABOM Bariatric Physician Lose the Right Weight [address] [place], [place], [place] [address] Office: [phone]  /  Fax: [phone]     Initial Visit  Adam Weiss) Adam Weiss, along with his mother, was seen in clinic today to evaluate for obesity. He is interested in losing weight to improve overall health and reduce the risk of weight related complications. He presents today to review program treatment options, initial physical assessment, and evaluation.   He was referred by: Self-Referral - he is currently enrolled in [facility] weight loss program, and has been for the past two years with only 5 LBS weight loss. They are exploring other alternatives due to the dissatisfaction. Has also tried [facility] loss program.  When asked what else they would they like to accomplish? He states: Improve existing medical conditions and prevent development of others.  When asked how has your weight affected you? He states: Has affected self-esteem, Contributed to medical problems, and Has affected mood  (anxiety)  Some associated conditions: Hypertension, Hx of Emotional/Stress Eating.   Contributing factors: family history of obesity (maternal), moderate to high levels of stress, and mental health problems (emotional eating)  He denies issues with eating patterns, fatty liver, high cholesterol, lung/kidney issues, meds contributing to weight gain. He does note a mood issue, but is unsure if this is related to his food/diet.   Weight promoting medications identified: None  Current nutrition plan: None and Portion control / smart choices Ready-to-Eat Meals (such as Healthy Choice or Lean Cuisine)  Current level of physical activity: None and Other: gym daily x1 hour  Current or previous pharmacotherapy: GLP-1 (Wegovy ) - He's reportedly seen improvement with Wegovy , losing 20-30 LBS within 1 year of starting, but due to insurance changes  had difficulty obtaining this. Denies ever trying Phentermine  or other weight loss medications.  Response to medication: lost weight, but regained.  Past medical history includes:   Past Medical History:  Diagnosis Date   ADHD    Autism    Objective:   BP 139/85   Pulse 74   Temp 97.7 F (36.5 C)   Ht 5' 10 (1.778 m)   Wt (!) 424 lb (192.3 kg)   SpO2 96%   BMI 60.84 kg/m  He was weighed on the bioimpedance scale:   Most Recent 02/18/19 - 02/16/24  BMI (Calculated) 60.84 02/13/24 08:00   Body Fat % 51 % 02/13/24 08:00  Visceral Fat Rating  40 02/13/24 08:00   General: Well Developed, well nourished, and in no acute distress.  HEENT: Normocephalic, atraumatic Skin: Warm and dry, cap RF less 2 sec, good turgor Chest:  Normal excursion, shape, no gross abn Respiratory: speaking in full sentences, no conversational dyspnea NeuroM-Sk: Ambulates w/o assistance, moves * 4 Psych: A and O *3, insight good, mood-full   Assessment and Plan:    Autism  History of explosive behavior has reportedly improved from when he was younger.   They report that he is fairly independent: he works for [employer] in [employer] and lives alone. His grandfather assists him with shopping (more ready made meals), he performs self-care and basic meal prep (frozen ready to heat meals), which his mother says she can supplement  by cooking for him. She also notes he can be somewhat of a selective eater.    Assessment & Plan: Poor insight into chronic disease of obesity. Did not appear to be aware that his lifestyle habits (diet/exercise) contribute  to obesity.  Hypertension, unspecified type  History of high blood pressure for the past 4-5 years reportedly. He does consume RTE meals due to difficulties with advanced meal prep, but mother volunteers to cook his meals.  Assessment & Plan: Not optimized. His blood pressure is >120/80 but <140/90. Medications: Losartan  100 mg daily.   - Plan to  reduce RTE meals as these are usually: processed, frozen, and containing excess salt for preservation.   BP Readings from Last 3 Encounters:  02/13/24 139/85  07/19/16 (!) 140/97  11/11/11 137/72   Lab Results  Component Value Date   CREATININE 0.96 07/18/2016     Obesity Treatment Plan: He will work on gathering support from family and friends to begin weight loss journey. Work on eliminating or reducing the presence of highly processed, calorie dense foods in the home. Complete provided nutritional and psychosocial assessment questionnaire prior to his next visit.  Assess for cardiometabolic complications and nutritional deficiencies via fasting serologies.  Assess REE via indirect calorimetry to guide the creation of a reduced calorie, high protein meal plan to promote loss of fat mass.   Adam Weiss will follow up in the next 1-2 weeks to review the above steps and continue evaluation and treatment of their disease of obesity  Obesity Education Performed Today: He was weighed on the bioimpedance scale and results were discussed and documented above  We discussed obesity as a disease and the importance of a more detailed evaluation of all the factors contributing to the disease.  We discussed the importance of long term lifestyle changes which include nutrition, exercise and behavioral modifications as well as the importance of customizing this to his specific health and social needs.  We discussed the benefits of reaching a healthier weight to alleviate the symptoms of existing conditions and reduce the risks of the biomechanical, metabolic and psychological effects of obesity.   Adam Weiss appears to be in the action stage of change and states they are ready to start intensive lifestyle modifications and behavioral modifications.  2 minutes spent to review patient's history and prechart, 40 minutes face-to-face, 8 minutes to review afterwards; 50 minutes total was spent  today on this visit including the above counseling, pre-visit chart review, and post-visit documentation.  Attestations:   Reviewed by clinician on day of visit: allergies, medications, problem list, medical history, surgical history, family history, social history, and previous encounter notes applicable to the diagnosis of obesity.  I, Emily Lagle, acting as a neurosurgeon for [employer], DO., have documented all relevant documentation on the behalf of Adam Jenkins, DO, while in the presence of Adam Jenkins, DO.  I, Adam Jenkins, DO, have reviewed all documentation for this visit. The documentation on 06/20/22 for the exam, diagnosis, procedures, and orders are all accurate and complete.  "
